# Patient Record
Sex: Female | Born: 1990 | Hispanic: No | Marital: Single | Smoking: Former smoker
Health system: Southern US, Community
[De-identification: ages and names within clinical notes are randomized; demographics above are authoritative.]

## PROBLEM LIST (undated history)

## (undated) DIAGNOSIS — K861 Other chronic pancreatitis: Secondary | ICD-10-CM

## (undated) DIAGNOSIS — J45909 Unspecified asthma, uncomplicated: Secondary | ICD-10-CM

---
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
## 2018-06-28 ENCOUNTER — Encounter (HOSPITAL_COMMUNITY): Payer: Self-pay | Admitting: Emergency Medicine

## 2018-06-28 ENCOUNTER — Emergency Department (HOSPITAL_COMMUNITY): Payer: Self-pay

## 2018-06-28 ENCOUNTER — Other Ambulatory Visit: Payer: Self-pay

## 2018-06-28 ENCOUNTER — Emergency Department (HOSPITAL_COMMUNITY)
Admission: EM | Admit: 2018-06-28 | Discharge: 2018-06-28 | Disposition: A | Discharge status: Home / Self Care | Payer: Self-pay | Attending: Emergency Medicine | Admitting: Emergency Medicine

## 2018-06-28 DIAGNOSIS — Z23 Encounter for immunization: Secondary | ICD-10-CM | POA: Insufficient documentation

## 2018-06-28 DIAGNOSIS — S51851A Open bite of right forearm, initial encounter: Secondary | ICD-10-CM | POA: Insufficient documentation

## 2018-06-28 DIAGNOSIS — J45909 Unspecified asthma, uncomplicated: Secondary | ICD-10-CM | POA: Insufficient documentation

## 2018-06-28 DIAGNOSIS — Z87891 Personal history of nicotine dependence: Secondary | ICD-10-CM | POA: Insufficient documentation

## 2018-06-28 DIAGNOSIS — Y999 Unspecified external cause status: Secondary | ICD-10-CM | POA: Insufficient documentation

## 2018-06-28 DIAGNOSIS — Y929 Unspecified place or not applicable: Secondary | ICD-10-CM | POA: Insufficient documentation

## 2018-06-28 DIAGNOSIS — T148XXA Other injury of unspecified body region, initial encounter: Secondary | ICD-10-CM

## 2018-06-28 DIAGNOSIS — W540XXA Bitten by dog, initial encounter: Secondary | ICD-10-CM | POA: Insufficient documentation

## 2018-06-28 DIAGNOSIS — Y939 Activity, unspecified: Secondary | ICD-10-CM | POA: Insufficient documentation

## 2018-06-28 DIAGNOSIS — Z2914 Encounter for prophylactic rabies immune globin: Secondary | ICD-10-CM | POA: Insufficient documentation

## 2018-06-28 HISTORY — DX: Unspecified asthma, uncomplicated: J45.909

## 2018-06-28 HISTORY — DX: Other chronic pancreatitis: K86.1

## 2018-06-28 MED ORDER — RABIES IMMUNE GLOBULIN 150 UNIT/ML IM INJ
20.0000 [IU]/kg | INJECTION | Freq: Once | INTRAMUSCULAR | Status: AC
  Administered 2018-06-28: 13:00:00 1425 [IU] via INTRAMUSCULAR
  Filled 2018-06-28: qty 10

## 2018-06-28 MED ORDER — TETANUS-DIPHTH-ACELL PERTUSSIS 5-2.5-18.5 LF-MCG/0.5 IM SUSP
0.5000 mL | Freq: Once | INTRAMUSCULAR | Status: AC
  Administered 2018-06-28: 12:00:00 0.5 mL via INTRAMUSCULAR
  Filled 2018-06-28: qty 0.5

## 2018-06-28 MED ORDER — AMOXICILLIN-POT CLAVULANATE 875-125 MG PO TABS: 1.0000 | ORAL_TABLET | Freq: Two times a day (BID) | ORAL | 0 refills | Status: AC

## 2018-06-28 MED ORDER — AMOXICILLIN-POT CLAVULANATE 875-125 MG PO TABS
1.0000 | ORAL_TABLET | Freq: Once | ORAL | Status: AC
  Administered 2018-06-28: 13:00:00 1 via ORAL
  Filled 2018-06-28: qty 1

## 2018-06-28 MED ORDER — RABIES VACCINE, PCEC IM SUSR
1.0000 mL | Freq: Once | INTRAMUSCULAR | Status: AC
  Administered 2018-06-28: 1 mL via INTRAMUSCULAR
  Filled 2018-06-28: qty 1

## 2018-06-28 NOTE — ED Notes (Signed)
Pt left without signing signature pad

## 2018-06-28 NOTE — ED Triage Notes (Signed)
Pt reports that she bitten by a medium sized dog on RT arm last night. Pt reports that she does not know whose dog it belonged to or if it was vaccinated. Pt has small abrasions and bruising to RT arm. Pt did not file a police report.

## 2018-06-28 NOTE — Discharge Instructions (Addendum)
Please see the information and instructions below regarding your visit.  Your diagnoses today include:  1. Animal bite     Tests performed today include: See side panel of your discharge paperwork for testing performed today. Vital signs are listed at the bottom of these instructions.   Medications prescribed:    Take any prescribed medications only as prescribed, and any over the counter medications only as directed on the packaging.  1. Please take all of your antibiotics until finished.   You may develop abdominal discomfort or nausea from the antibiotic. If this occurs, you may take it with food. Some patients also get diarrhea with antibiotics. You may help offset this with probiotics which you can buy or get in yogurt. Do not eat or take the probiotics until 2 hours after your antibiotic. Some women develop vaginal yeast infections after antibiotics. If you develop unusual vaginal discharge after being on this medication, please see your primary care provider.   Some people develop allergies to antibiotics. Symptoms of antibiotic allergy can be mild and include a flat rash and itching. They can also be more serious and include:  ?Hives - Hives are raised, red patches of skin that are usually very itchy.  ?Lip or tongue swelling  ?Trouble swallowing or breathing  ?Blistering of the skin or mouth.  If you have any of these serious symptoms, please seek emergency medical care immediately.  Home care instructions:  Please follow any educational materials contained in this packet.    Keep affected area above the level of your heart when possible. Wash area gently twice a day with warm soapy water. Do not apply alcohol or hydrogen peroxide. Cover the area if it draining or weeping.   Follow-up instructions: Please follow-up with your primary care provider or the ED in 48 hours for a check of the infection if symptoms are not improving.   Return instructions:  Please return to the  Emergency Department if you experience worsening symptoms. Call your doctor sooner or return to the ER if you develop worsening signs of infection such as: increased redness, increased pain, pus, fever, or other symptoms that concern you.  Please return if you have any other emergent concerns.  Additional Information:   Your vital signs today were: BP (!) 131/99 (BP Location: Right Arm)    Pulse 98    Temp 98.2 F (36.8 C) (Oral)    Resp 14    Ht 5\' 4"  (1.626 m)    Wt 72.6 kg    LMP 06/13/2018 (Approximate)    SpO2 96%    BMI 27.46 kg/m  If your blood pressure (BP) was elevated on multiple readings during this visit above 130 for the top number or above 80 for the bottom number, please have this repeated by your primary care provider within one month. --------------  Thank you for allowing [place] to participate in your care today. It was a pleasure taking care of you today!

## 2018-06-28 NOTE — ED Provider Notes (Signed)
[facility] EMERGENCY DEPARTMENT Provider Note   CSN: [id number] Arrival date & time: 06/28/18  1025     History   Chief Complaint Chief Complaint  Patient presents with  . Animal Bite    HPI Gina Myers is a 28 y.o. female.     HPI   Patient is a 28 year old female with past medical history of asthma chronic pancreatitis presenting for dog bite to the right arm and right hand.  She reports that last night as she was heading home [place] a stray dog bit her on the right forearm and right hand.  She reports the puncture was were not deep but this morning she had some ecchymosis on the bite of the right forearm.  She denies any numbness or tingling in her arm.  Unknown last tetanus shot.  She was not able to fall the dog, and does not know its owner or if it is vaccinated.  She reports that she extensively washed out the wound last night.  Past Medical History:  Diagnosis Date  . Asthma   . Pancreatitis, chronic (HCC)     There are no active problems to display for this patient.   History reviewed. No pertinent surgical history.   OB History   No obstetric history on file.      Home Medications    Prior to Admission medications   Medication Sig Start Date End Date Taking? Authorizing Provider  amoxicillin-clavulanate (AUGMENTIN) 875-125 MG tablet Take 1 tablet by mouth every 12 (twelve) hours for 5 days. 06/28/18 07/03/18  Elisha PonderMurray, Reilly Molchan B, PA-C    Family History No family history on file.  Social History Social History   Tobacco Use  . Smoking status: Former Games developermoker  . Smokeless tobacco: Never Used  Substance Use Topics  . Alcohol use: Yes    Comment: socially   . Drug use: Not Currently     Allergies   Patient has no known allergies.   Review of Systems Review of Systems  Constitutional: Negative for chills and fever.  Musculoskeletal: Positive for arthralgias.  Skin: Positive for wound. Negative for color change.   Neurological: Negative for weakness and numbness.     Physical Exam Updated Vital Signs BP (!) 131/99 (BP Location: Right Arm)   Pulse 98   Temp 98.2 F (36.8 C) (Oral)   Resp 14   Ht 5\' 4"  (1.626 m)   Wt 72.6 kg   LMP 06/13/2018 (Approximate)   SpO2 96%   BMI 27.46 kg/m   Physical Exam Vitals signs and nursing note reviewed.  Constitutional:      General: She is not in acute distress.    Appearance: She is well-developed. She is not diaphoretic.     Comments: Sitting comfortably in bed.  HENT:     Head: Normocephalic and atraumatic.  Eyes:     General:        Right eye: No discharge.        Left eye: No discharge.     Conjunctiva/sclera: Conjunctivae normal.     Comments: EOMs normal to gross examination.  Neck:     Musculoskeletal: Normal range of motion.  Cardiovascular:     Rate and Rhythm: Normal rate and regular rhythm.     Comments: Intact, 2+ right radial pulse. Pulmonary:     Effort: Pulmonary effort is normal.     Comments: Patient converses comfortably without audible wheeze or stridor. Abdominal:  General: There is no distension.  Musculoskeletal: Normal range of motion.     Comments: Patient has area of ecchymosis on the dorsal right forearm.  Scratches present but unable to locate puncture wound.  Patient has a small puncture wound of the ulnar aspect of the right thumb as well as volar aspect of the index finger.  Wound is hemostatic and scabbed over.   Skin:    General: Skin is warm and dry.  Neurological:     Mental Status: She is alert.     Comments: Cranial nerves intact to gross observation. Patient moves extremities without difficulty.  Psychiatric:        Behavior: Behavior normal.        Thought Content: Thought content normal.        Judgment: Judgment normal.      ED Treatments / Results  Labs (all labs ordered are listed, but only abnormal results are displayed) Labs Reviewed - No data to display  EKG    Radiology Dg  Forearm Right  Result Date: 06/28/2018 CLINICAL DATA:  Dog bite last night. EXAM: RIGHT FOREARM - 2 VIEW COMPARISON:  None. FINDINGS: There is no evidence of fracture or other focal bone lesions. Soft tissues are unremarkable. IMPRESSION: Negative. Electronically Signed   By: Francene BoyersJames  Maxwell M.D.   On: 06/28/2018 13:01   Dg Hand Complete Right  Result Date: 06/28/2018 CLINICAL DATA:  Dog bite last night.  Abrasions and bruising. EXAM: RIGHT HAND - COMPLETE 3+ VIEW COMPARISON:  None. FINDINGS: There is no evidence of fracture or dislocation. There is no evidence of arthropathy or other focal bone abnormality. Soft tissues are unremarkable. IMPRESSION: Negative. Electronically Signed   By: Francene BoyersJames  Maxwell M.D.   On: 06/28/2018 13:00    Procedures Procedures (including critical care time)  Medications Ordered in ED Medications  Tdap (BOOSTRIX) injection 0.5 mL (0.5 mLs Intramuscular Given 06/28/18 1224)  rabies immune globulin (HYPERAB/KEDRAB) injection 1,425 Units (1,425 Units Intramuscular Given 06/28/18 1310)  rabies vaccine (RABAVERT) injection 1 mL (1 mL Intramuscular Given 06/28/18 1221)  amoxicillin-clavulanate (AUGMENTIN) 875-125 MG per tablet 1 tablet (1 tablet Oral Given 06/28/18 1315)     Initial Impression / Assessment and Plan / ED Course  I have reviewed the triage vital signs and the nursing notes.  Pertinent labs & imaging results that were available during my care of the patient were reviewed by me and considered in my medical decision making (see chart for details).  Clinical Course as of Jun 27 1717  Wed Jun 28, 2018  1217 Pt reports no chance of pregnancy and would like to defer pregnancy test.    [AM]    Clinical Course User Index [AM] Elisha PonderMurray, Ryeleigh Santore B, PA-C       This is a well-appearing 28 year old female presenting for dog bite of the right forearm and hand.  The vaccination status of the dog is unknown therefore rabies vaccinations were initiated.  Tetanus updated  as well.  Radiographs demonstrate no soft tissue abnormalities or foreign bodies of the skin.  Patient with started on Augmentin for infection prophylaxis.  She is given return precautions for any increasing swelling, pain and paresthesias.  Patient is in understanding and agrees with plan of care.  Final Clinical Impressions(s) / ED Diagnoses   Final diagnoses:  Animal bite    ED Discharge Orders         Ordered    amoxicillin-clavulanate (AUGMENTIN) 875-125 MG tablet  Every 12 hours  06/28/18 [place], [place], PA-C 06/28/18 1728    Elnora Morrison, MD 07/01/18 [phone]

## 2020-04-18 IMAGING — DX RIGHT HAND - COMPLETE 3+ VIEW
3 series · 3 of 3 positions shown · non-contrast
Comparison: None.

CLINICAL DATA: Dog bite last night.  Abrasions and bruising.

EXAM:
RIGHT HAND - COMPLETE 3+ VIEW

[hand pa]
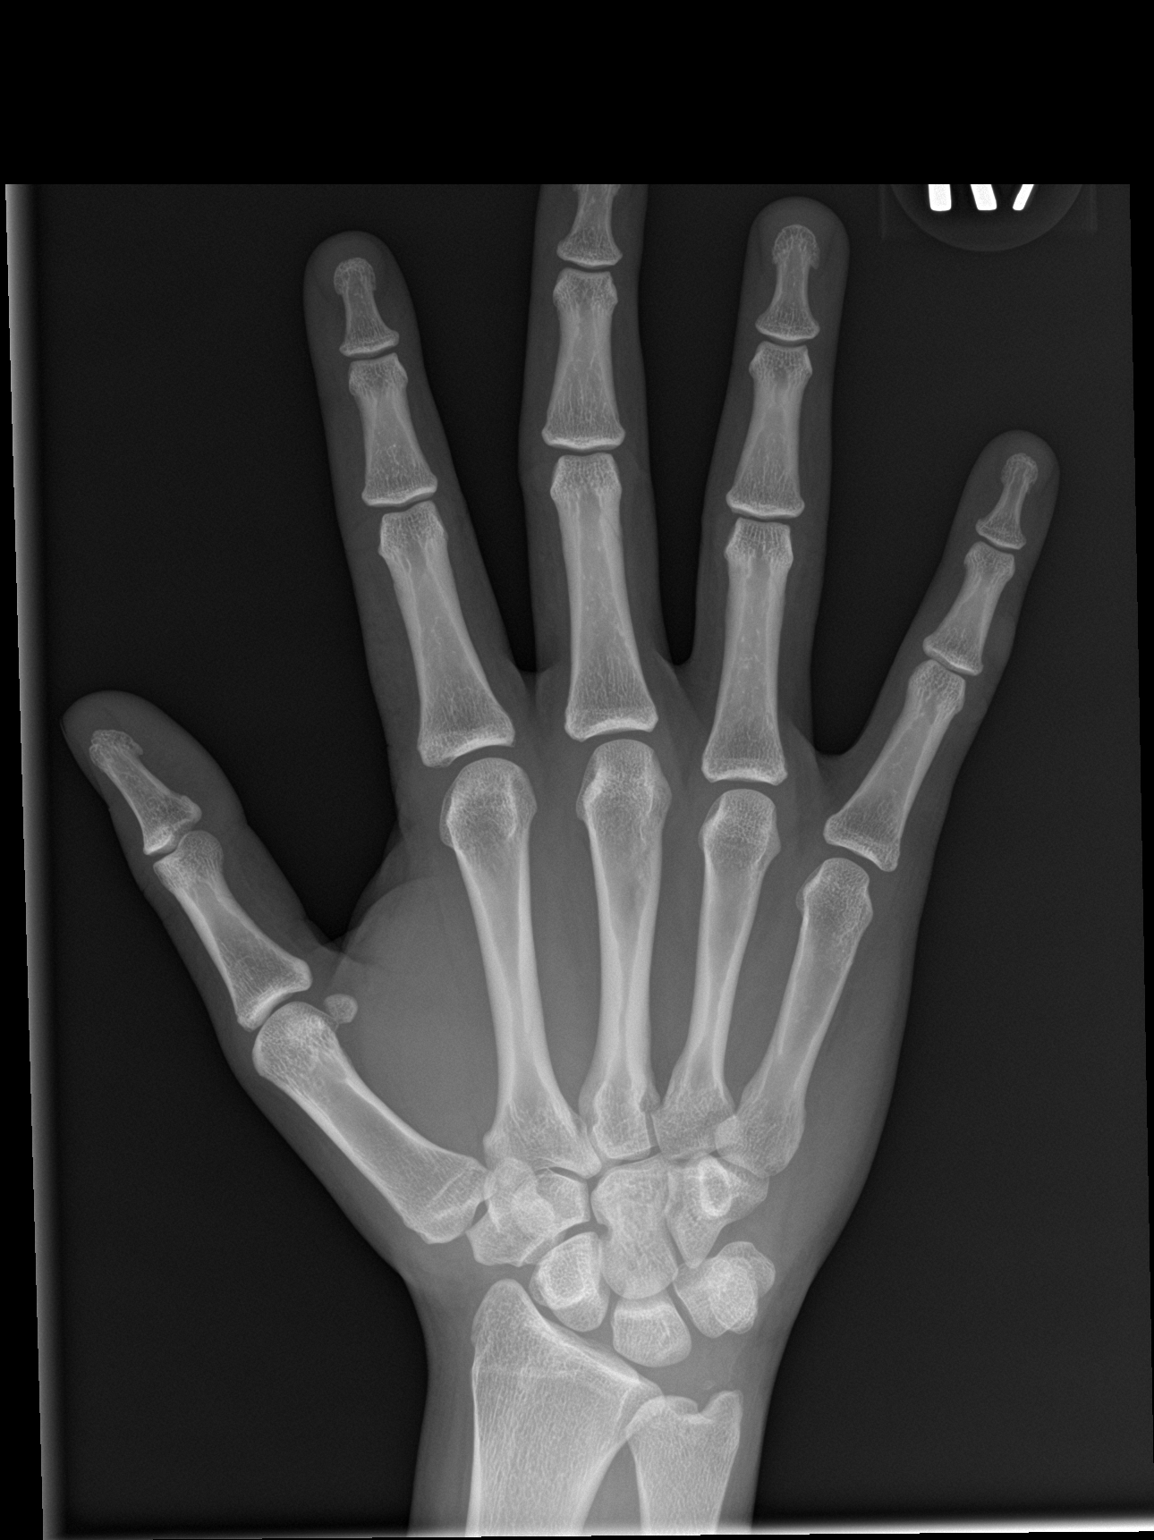

[hand obl]
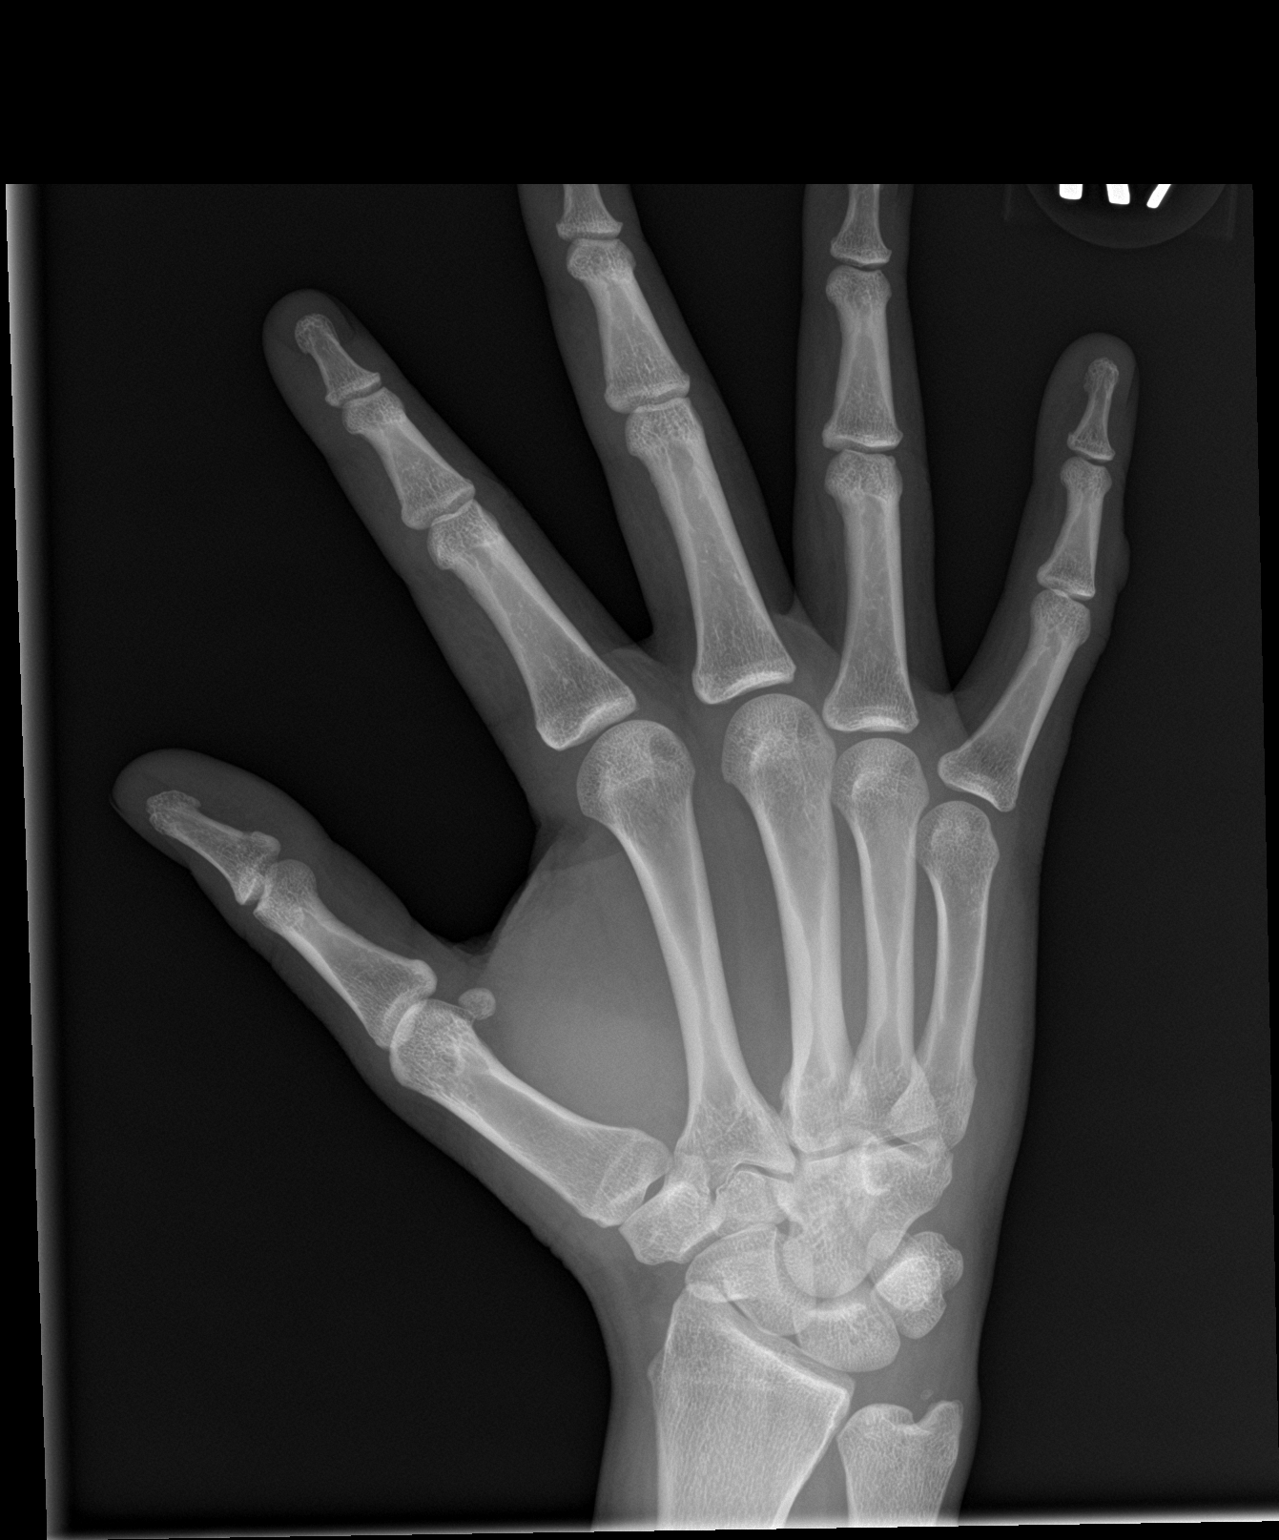

[hand lat]
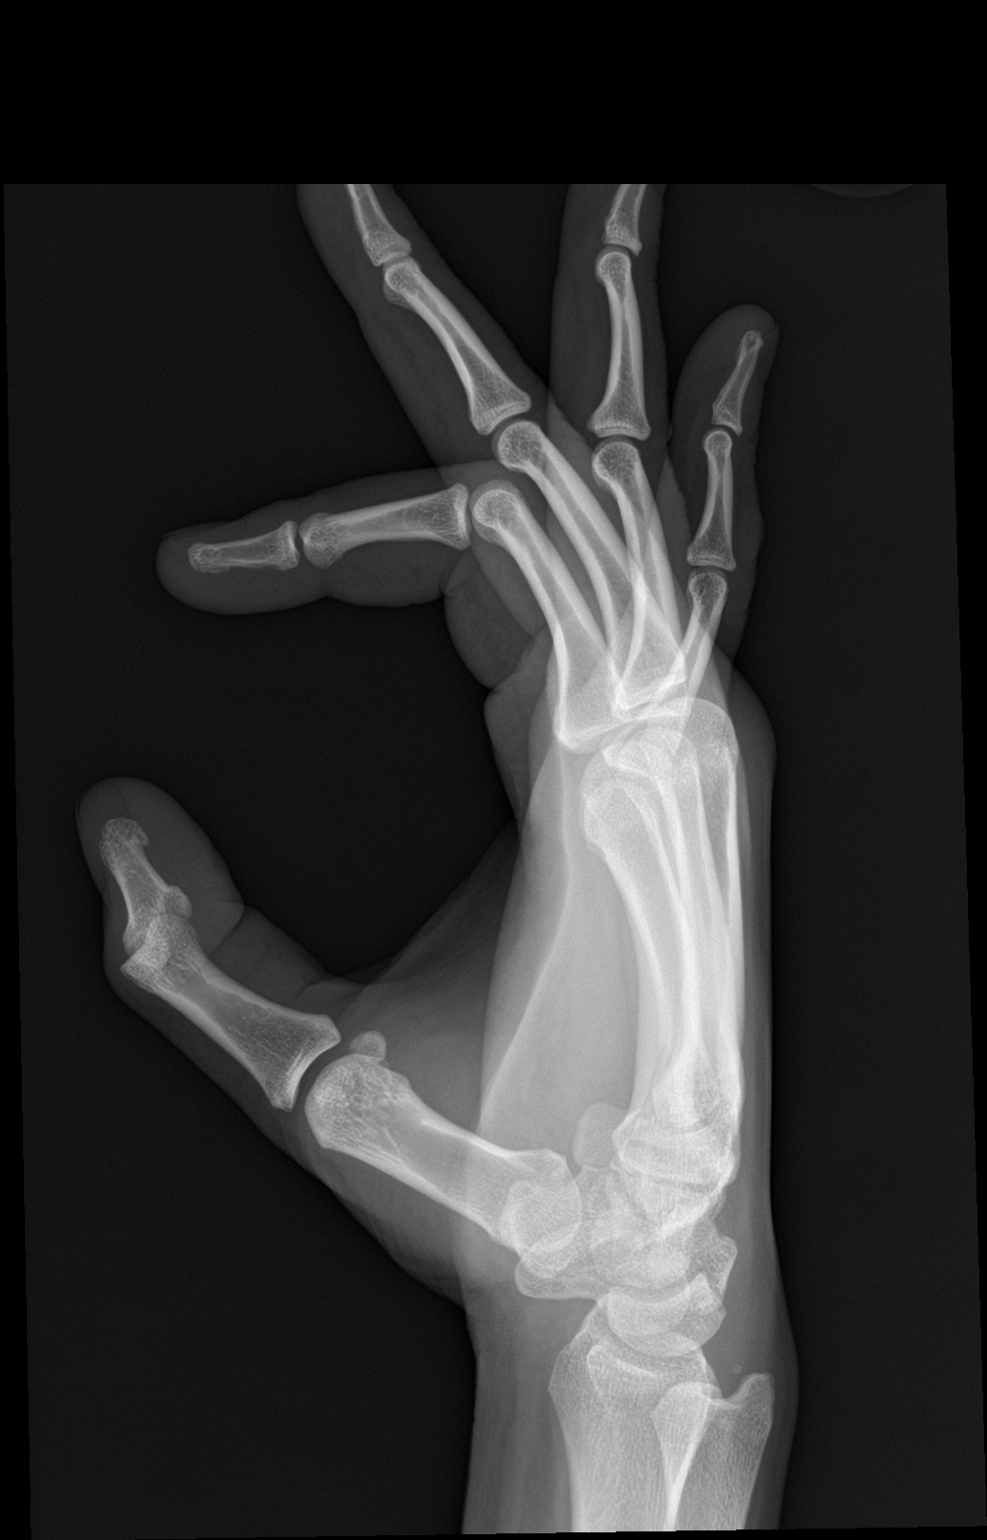

[3 of 3 positions shown; findings below may reference images not displayed]

FINDINGS: There is no evidence of fracture or dislocation. There is no
evidence of arthropathy or other focal bone abnormality. Soft
tissues are unremarkable.
IMPRESSION: Negative.
